# Patient Record
Sex: Male | Born: 1975 | Race: White | Hispanic: No | Marital: Single
Health system: Southern US, Community
[De-identification: ages and names within clinical notes are randomized; demographics above are authoritative.]

---

## 2011-09-27 ENCOUNTER — Other Ambulatory Visit: Payer: Self-pay | Admitting: Family Medicine

## 2011-09-27 DIAGNOSIS — R7401 Elevation of levels of liver transaminase levels: Secondary | ICD-10-CM

## 2011-09-28 ENCOUNTER — Ambulatory Visit
Admission: RE | Admit: 2011-09-28 | Discharge: 2011-09-28 | Disposition: A | Discharge status: Home / Self Care | Payer: BC Managed Care – PPO | Source: Ambulatory Visit | Attending: Family Medicine | Admitting: Family Medicine

## 2011-09-28 DIAGNOSIS — R7401 Elevation of levels of liver transaminase levels: Secondary | ICD-10-CM

## 2013-02-15 IMAGING — US US ABDOMEN COMPLETE
1 series · 14 of 25 positions shown · non-contrast
Comparison: None.

CLINICAL DATA: Abnormal liver enzymes, elevated LFTs

ABDOMINAL ULTRASOUND COMPLETE

[Series 1: us abdomen complete · 0.33mm/px · 14 of 63 slices shown]
[im 1/63]
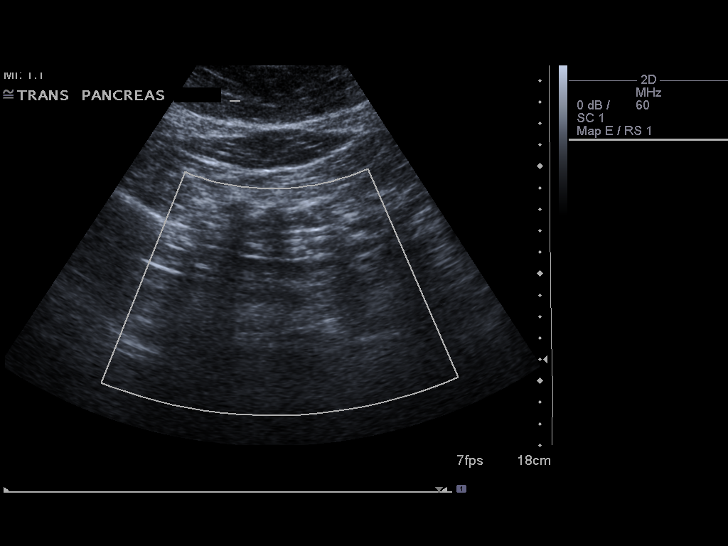
[im 6/63]
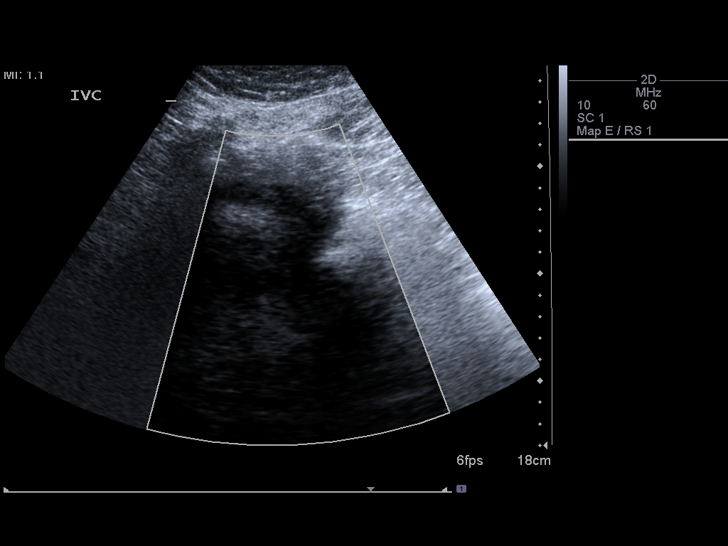
[im 11/63]
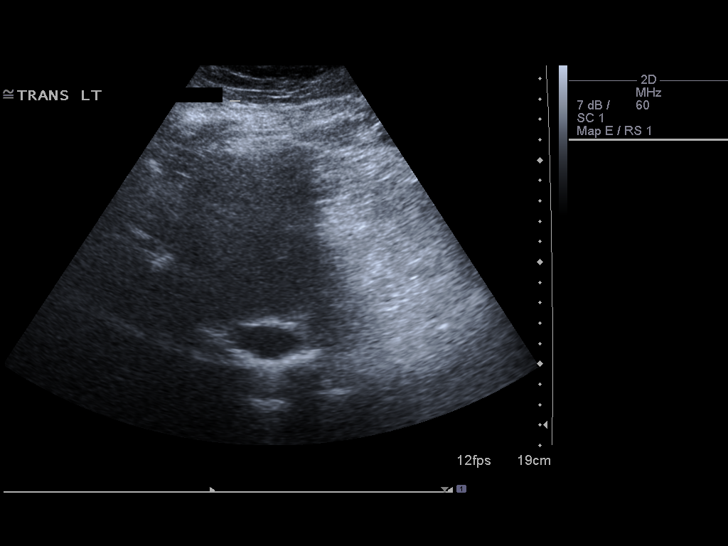
[im 16/63]
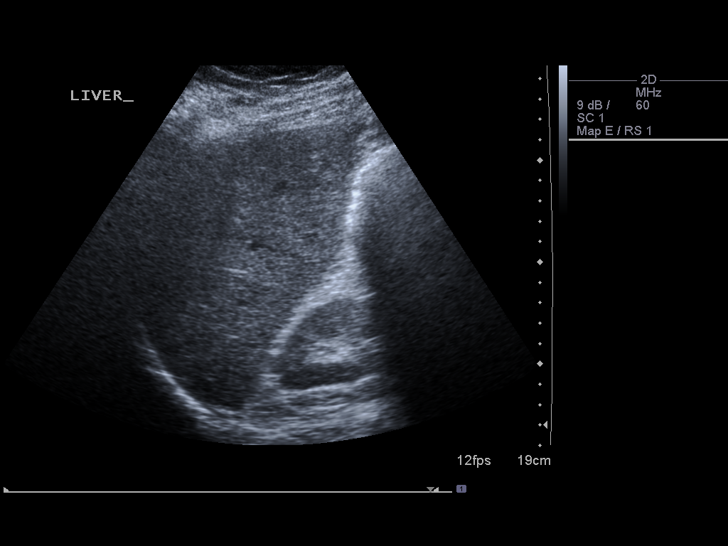
[im 21/63]
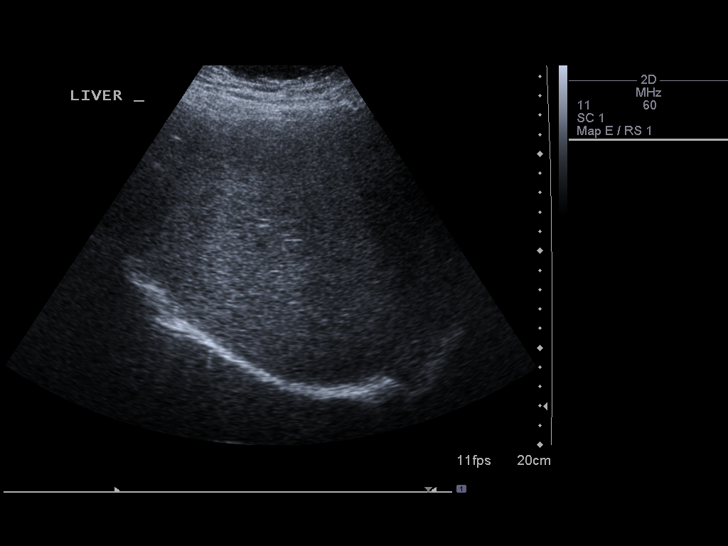
[im 24/63]
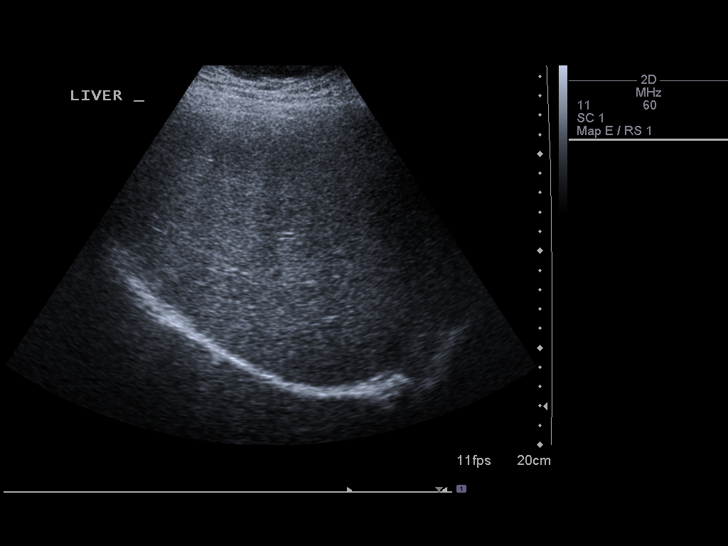
[im 29/63]
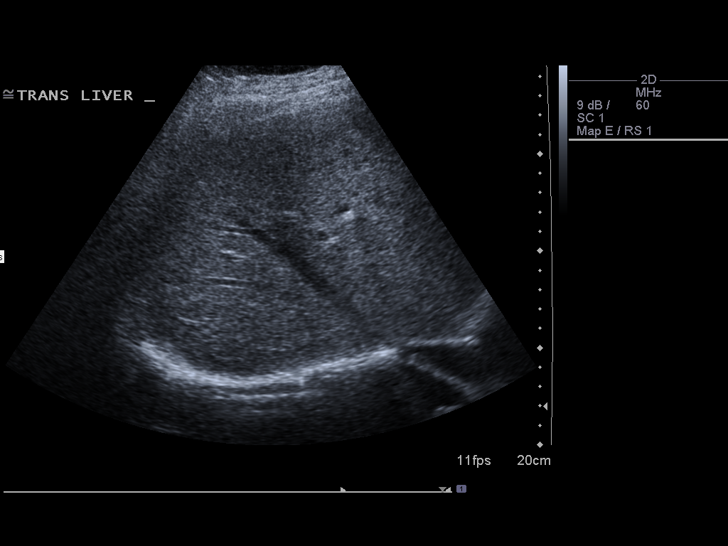
[im 34/63]
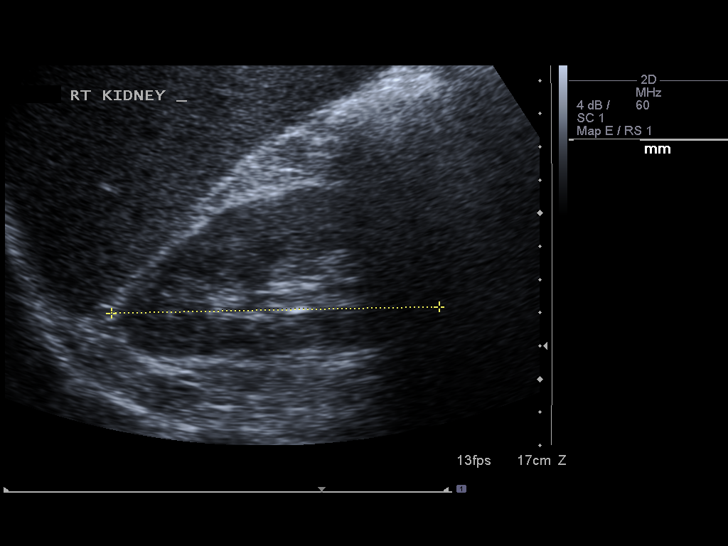
[im 39/63]
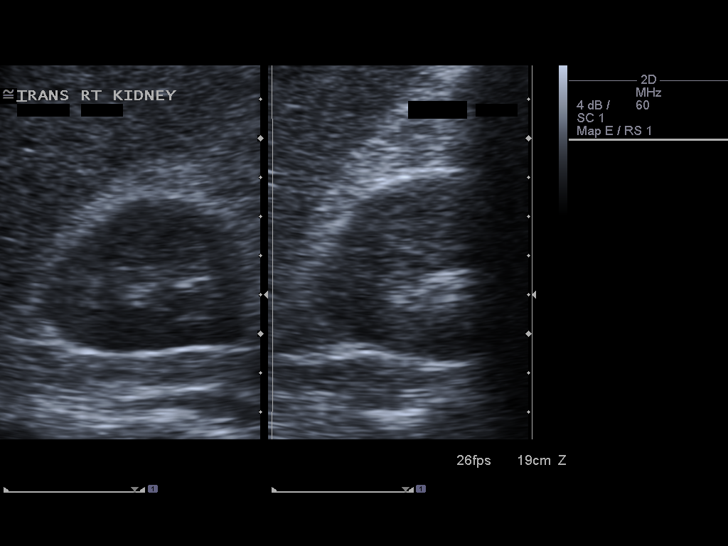
[im 42/63]
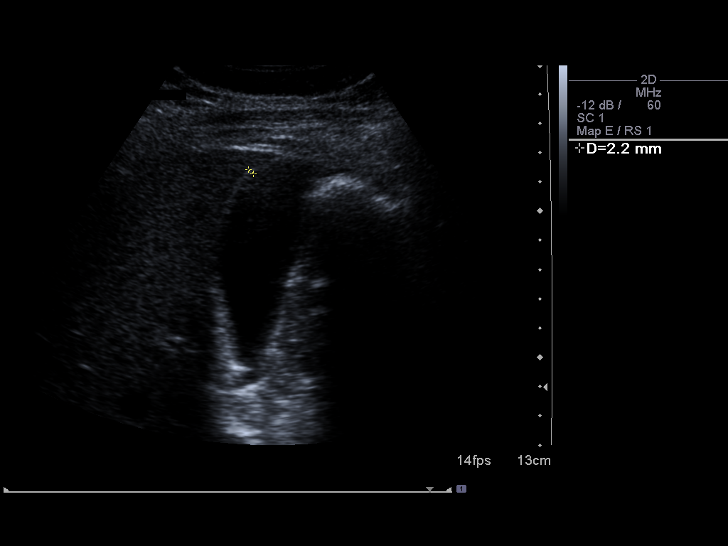
[im 47/63]
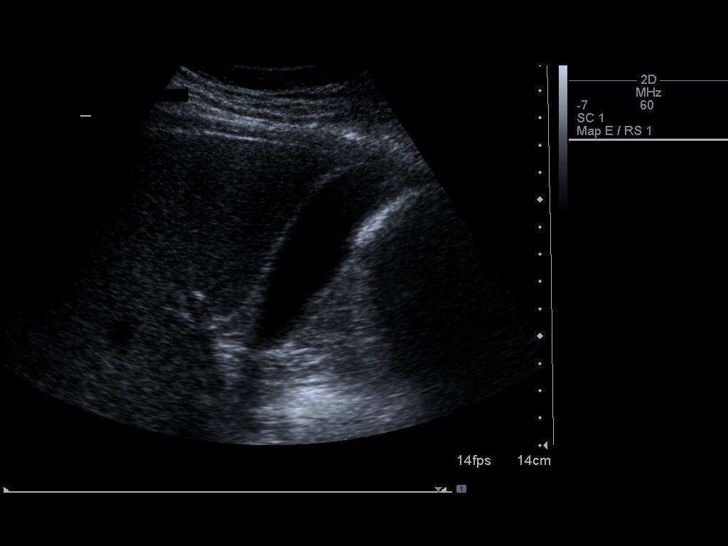
[im 52/63]
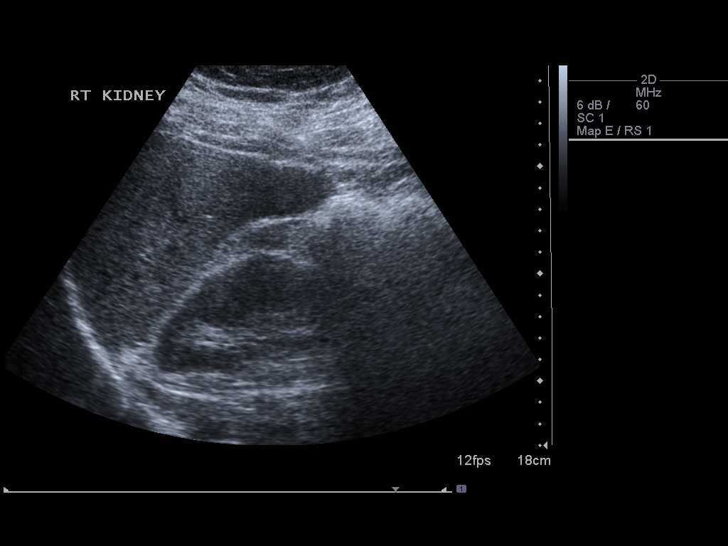
[im 57/63]
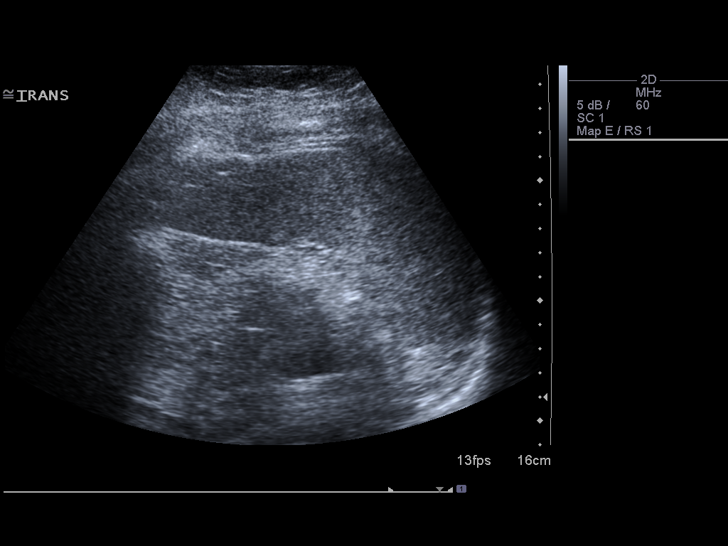
[im 63/63]
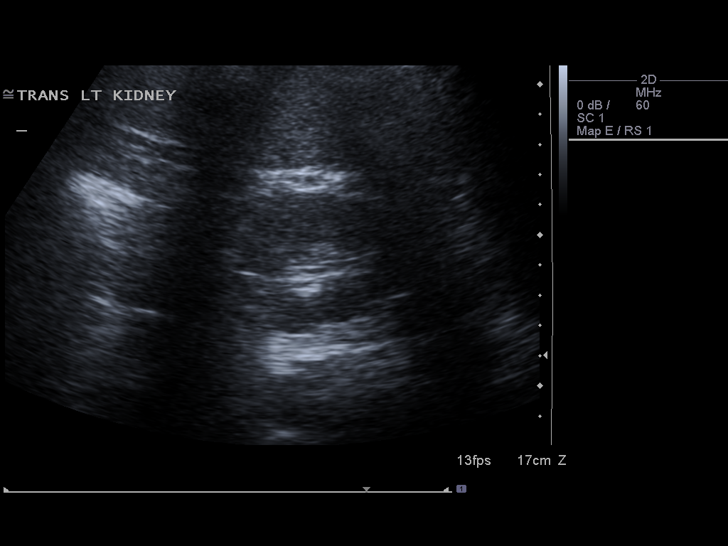

[14 of 25 positions shown; findings below may reference images not displayed]

FINDINGS: Gallbladder:  No gallstones, gallbladder wall thickening, or
pericholecystic fluid.

Common Bile Duct:  Within normal limits in caliber.

Liver: Mild heterogeneous increased echogenicity compatible with
hepatic steatosis or fatty infiltration.  No definite focal hepatic
abnormality or intrahepatic biliary dilatation.

IVC:  Not well visualized because of obscuring bowel gas.

Pancreas:  Not well visualized secondary to obscuring bowel gas.
No definite gross abnormality.

Spleen:  Within normal limits in size and echotexture.

Right kidney:  Normal in size and parenchymal echogenicity.  No
definite focal abnormality or hydronephrosis.  Limited
visualization of the lower pole because of obscuring bowel gas.

Left kidney:  Normal in size and parenchymal echogenicity.  No
evidence of mass or hydronephrosis.

Abdominal Aorta:  No aneurysm identified.
IMPRESSION: Limited exam as above.  No definite acute finding by ultrasound.

Probable mild hepatic steatosis

Negative for gallstones

## 2016-01-01 DIAGNOSIS — Z79899 Other long term (current) drug therapy: Secondary | ICD-10-CM | POA: Diagnosis not present

## 2016-01-01 DIAGNOSIS — Z13220 Encounter for screening for lipoid disorders: Secondary | ICD-10-CM | POA: Diagnosis not present

## 2016-05-26 DIAGNOSIS — Z23 Encounter for immunization: Secondary | ICD-10-CM | POA: Diagnosis not present

## 2016-06-22 DIAGNOSIS — Z23 Encounter for immunization: Secondary | ICD-10-CM | POA: Diagnosis not present

## 2016-06-22 DIAGNOSIS — S71119A Laceration without foreign body, unspecified thigh, initial encounter: Secondary | ICD-10-CM | POA: Diagnosis not present

## 2016-10-28 DIAGNOSIS — F338 Other recurrent depressive disorders: Secondary | ICD-10-CM | POA: Diagnosis not present

## 2016-10-28 DIAGNOSIS — Z79899 Other long term (current) drug therapy: Secondary | ICD-10-CM | POA: Diagnosis not present

## 2016-10-28 DIAGNOSIS — F900 Attention-deficit hyperactivity disorder, predominantly inattentive type: Secondary | ICD-10-CM | POA: Diagnosis not present

## 2017-05-05 DIAGNOSIS — Z23 Encounter for immunization: Secondary | ICD-10-CM | POA: Diagnosis not present

## 2017-05-05 DIAGNOSIS — F338 Other recurrent depressive disorders: Secondary | ICD-10-CM | POA: Diagnosis not present

## 2017-05-05 DIAGNOSIS — F900 Attention-deficit hyperactivity disorder, predominantly inattentive type: Secondary | ICD-10-CM | POA: Diagnosis not present

## 2017-05-09 DIAGNOSIS — Z8249 Family history of ischemic heart disease and other diseases of the circulatory system: Secondary | ICD-10-CM | POA: Diagnosis not present

## 2017-05-09 DIAGNOSIS — E668 Other obesity: Secondary | ICD-10-CM | POA: Diagnosis not present

## 2017-05-20 DIAGNOSIS — Z8249 Family history of ischemic heart disease and other diseases of the circulatory system: Secondary | ICD-10-CM | POA: Diagnosis not present

## 2017-11-03 DIAGNOSIS — F338 Other recurrent depressive disorders: Secondary | ICD-10-CM | POA: Diagnosis not present

## 2017-11-03 DIAGNOSIS — I517 Cardiomegaly: Secondary | ICD-10-CM | POA: Diagnosis not present

## 2017-11-03 DIAGNOSIS — F900 Attention-deficit hyperactivity disorder, predominantly inattentive type: Secondary | ICD-10-CM | POA: Diagnosis not present

## 2018-05-25 DIAGNOSIS — Z23 Encounter for immunization: Secondary | ICD-10-CM | POA: Diagnosis not present

## 2018-06-14 DIAGNOSIS — Z0000 Encounter for general adult medical examination without abnormal findings: Secondary | ICD-10-CM | POA: Diagnosis not present

## 2018-06-14 DIAGNOSIS — R8290 Unspecified abnormal findings in urine: Secondary | ICD-10-CM | POA: Diagnosis not present

## 2018-06-14 DIAGNOSIS — E785 Hyperlipidemia, unspecified: Secondary | ICD-10-CM | POA: Diagnosis not present

## 2018-06-14 DIAGNOSIS — Z79899 Other long term (current) drug therapy: Secondary | ICD-10-CM | POA: Diagnosis not present

## 2018-06-14 DIAGNOSIS — Z125 Encounter for screening for malignant neoplasm of prostate: Secondary | ICD-10-CM | POA: Diagnosis not present

## 2018-12-22 DIAGNOSIS — F900 Attention-deficit hyperactivity disorder, predominantly inattentive type: Secondary | ICD-10-CM | POA: Diagnosis not present

## 2018-12-22 DIAGNOSIS — I517 Cardiomegaly: Secondary | ICD-10-CM | POA: Diagnosis not present

## 2019-02-26 DIAGNOSIS — H04123 Dry eye syndrome of bilateral lacrimal glands: Secondary | ICD-10-CM | POA: Diagnosis not present

## 2019-04-13 DIAGNOSIS — Z23 Encounter for immunization: Secondary | ICD-10-CM | POA: Diagnosis not present

## 2019-07-18 DIAGNOSIS — Z125 Encounter for screening for malignant neoplasm of prostate: Secondary | ICD-10-CM | POA: Diagnosis not present

## 2019-07-18 DIAGNOSIS — Z13220 Encounter for screening for lipoid disorders: Secondary | ICD-10-CM | POA: Diagnosis not present

## 2019-07-18 DIAGNOSIS — Z79899 Other long term (current) drug therapy: Secondary | ICD-10-CM | POA: Diagnosis not present

## 2019-07-18 DIAGNOSIS — F900 Attention-deficit hyperactivity disorder, predominantly inattentive type: Secondary | ICD-10-CM | POA: Diagnosis not present

## 2019-07-18 DIAGNOSIS — Z0000 Encounter for general adult medical examination without abnormal findings: Secondary | ICD-10-CM | POA: Diagnosis not present

## 2019-07-19 ENCOUNTER — Other Ambulatory Visit (HOSPITAL_COMMUNITY): Payer: Self-pay | Admitting: Family Medicine

## 2019-07-19 DIAGNOSIS — I517 Cardiomegaly: Secondary | ICD-10-CM

## 2019-07-30 ENCOUNTER — Other Ambulatory Visit: Payer: Self-pay

## 2019-07-30 ENCOUNTER — Ambulatory Visit (HOSPITAL_COMMUNITY): Payer: BLUE CROSS/BLUE SHIELD | Attending: Cardiology

## 2019-07-30 DIAGNOSIS — I517 Cardiomegaly: Secondary | ICD-10-CM | POA: Diagnosis not present

## 2019-10-04 ENCOUNTER — Ambulatory Visit: Payer: Self-pay | Attending: Internal Medicine

## 2019-10-04 DIAGNOSIS — Z23 Encounter for immunization: Secondary | ICD-10-CM | POA: Insufficient documentation

## 2019-10-04 NOTE — Progress Notes (Signed)
   Covid-19 Vaccination Clinic  Name:  [name]    MRN: [id number] DOB: [birth year]  10/04/2019  Mr. Renz was observed post Covid-19 immunization for 15 minutes without incident. He was provided with Vaccine Information Sheet and instruction to access the V-Safe system.   Mr. Bartnick was instructed to call 911 with any severe reactions post vaccine: Marland Kitchen Difficulty breathing  . Swelling of face and throat  . A fast heartbeat  . A bad rash all over body  . Dizziness and weakness   Immunizations Administered    Name Date Dose VIS Date Route   Pfizer COVID-19 Vaccine 10/04/2019 12:22 PM 0.3 mL 07/13/2019 Intramuscular   Manufacturer: [employer], [employer]   Lot: [id number]   NDC: [address]-4

## 2019-10-30 ENCOUNTER — Ambulatory Visit: Payer: Self-pay | Attending: Internal Medicine

## 2019-10-30 DIAGNOSIS — Z23 Encounter for immunization: Secondary | ICD-10-CM

## 2019-10-30 NOTE — Progress Notes (Signed)
   Covid-19 Vaccination Clinic  Name:  Larry Hanson    MRN: [id number] DOB: Mar 13, 1976  10/30/2019  Larry Hanson was observed post Covid-19 immunization for 15 minutes without incident. He was provided with Vaccine Information Sheet and instruction to access the V-Safe system.   Larry Hanson was instructed to call 911 with any severe reactions post vaccine: Marland Kitchen Difficulty breathing  . Swelling of face and throat  . A fast heartbeat  . A bad rash all over body  . Dizziness and weakness   Immunizations Administered    Name Date Dose VIS Date Route   Pfizer COVID-19 Vaccine 10/30/2019  3:36 PM 0.3 mL 07/13/2019 Intramuscular   Manufacturer: [employer], [employer]   Lot: [id number]   NDC: [address]-3

## 2020-01-17 DIAGNOSIS — I517 Cardiomegaly: Secondary | ICD-10-CM | POA: Diagnosis not present

## 2020-01-17 DIAGNOSIS — F900 Attention-deficit hyperactivity disorder, predominantly inattentive type: Secondary | ICD-10-CM | POA: Diagnosis not present

## 2020-01-17 DIAGNOSIS — Z79899 Other long term (current) drug therapy: Secondary | ICD-10-CM | POA: Diagnosis not present

## 2020-01-17 DIAGNOSIS — E7800 Pure hypercholesterolemia, unspecified: Secondary | ICD-10-CM | POA: Diagnosis not present

## 2020-03-03 DIAGNOSIS — H04123 Dry eye syndrome of bilateral lacrimal glands: Secondary | ICD-10-CM | POA: Diagnosis not present

## 2020-08-07 DIAGNOSIS — E7800 Pure hypercholesterolemia, unspecified: Secondary | ICD-10-CM | POA: Diagnosis not present

## 2020-08-07 DIAGNOSIS — Z0000 Encounter for general adult medical examination without abnormal findings: Secondary | ICD-10-CM | POA: Diagnosis not present

## 2020-08-07 DIAGNOSIS — Z23 Encounter for immunization: Secondary | ICD-10-CM | POA: Diagnosis not present

## 2020-08-07 DIAGNOSIS — F900 Attention-deficit hyperactivity disorder, predominantly inattentive type: Secondary | ICD-10-CM | POA: Diagnosis not present

## 2020-10-14 DIAGNOSIS — W278XXA Contact with other nonpowered hand tool, initial encounter: Secondary | ICD-10-CM | POA: Diagnosis not present

## 2020-10-14 DIAGNOSIS — S61211A Laceration without foreign body of left index finger without damage to nail, initial encounter: Secondary | ICD-10-CM | POA: Diagnosis not present

## 2020-10-16 DIAGNOSIS — S61211D Laceration without foreign body of left index finger without damage to nail, subsequent encounter: Secondary | ICD-10-CM | POA: Diagnosis not present

## 2021-01-20 DIAGNOSIS — F3341 Major depressive disorder, recurrent, in partial remission: Secondary | ICD-10-CM | POA: Diagnosis not present

## 2021-01-20 DIAGNOSIS — I10 Essential (primary) hypertension: Secondary | ICD-10-CM | POA: Diagnosis not present

## 2021-01-20 DIAGNOSIS — F900 Attention-deficit hyperactivity disorder, predominantly inattentive type: Secondary | ICD-10-CM | POA: Diagnosis not present

## 2021-03-06 DIAGNOSIS — H04123 Dry eye syndrome of bilateral lacrimal glands: Secondary | ICD-10-CM | POA: Diagnosis not present

## 2021-08-18 DIAGNOSIS — Z0000 Encounter for general adult medical examination without abnormal findings: Secondary | ICD-10-CM | POA: Diagnosis not present

## 2021-08-18 DIAGNOSIS — E7800 Pure hypercholesterolemia, unspecified: Secondary | ICD-10-CM | POA: Diagnosis not present

## 2021-08-18 DIAGNOSIS — I10 Essential (primary) hypertension: Secondary | ICD-10-CM | POA: Diagnosis not present

## 2021-08-18 DIAGNOSIS — F3341 Major depressive disorder, recurrent, in partial remission: Secondary | ICD-10-CM | POA: Diagnosis not present

## 2021-08-18 DIAGNOSIS — F900 Attention-deficit hyperactivity disorder, predominantly inattentive type: Secondary | ICD-10-CM | POA: Diagnosis not present

## 2022-02-18 DIAGNOSIS — F900 Attention-deficit hyperactivity disorder, predominantly inattentive type: Secondary | ICD-10-CM | POA: Diagnosis not present

## 2022-02-18 DIAGNOSIS — K760 Fatty (change of) liver, not elsewhere classified: Secondary | ICD-10-CM | POA: Diagnosis not present

## 2022-02-18 DIAGNOSIS — I10 Essential (primary) hypertension: Secondary | ICD-10-CM | POA: Diagnosis not present

## 2022-02-18 DIAGNOSIS — F3341 Major depressive disorder, recurrent, in partial remission: Secondary | ICD-10-CM | POA: Diagnosis not present

## 2022-03-09 DIAGNOSIS — H2513 Age-related nuclear cataract, bilateral: Secondary | ICD-10-CM | POA: Diagnosis not present

## 2022-03-11 DIAGNOSIS — Z1211 Encounter for screening for malignant neoplasm of colon: Secondary | ICD-10-CM | POA: Diagnosis not present

## 2022-03-11 DIAGNOSIS — D122 Benign neoplasm of ascending colon: Secondary | ICD-10-CM | POA: Diagnosis not present

## 2022-03-11 DIAGNOSIS — D120 Benign neoplasm of cecum: Secondary | ICD-10-CM | POA: Diagnosis not present

## 2022-08-24 DIAGNOSIS — Z0000 Encounter for general adult medical examination without abnormal findings: Secondary | ICD-10-CM | POA: Diagnosis not present

## 2022-08-24 DIAGNOSIS — K760 Fatty (change of) liver, not elsewhere classified: Secondary | ICD-10-CM | POA: Diagnosis not present

## 2022-08-24 DIAGNOSIS — F900 Attention-deficit hyperactivity disorder, predominantly inattentive type: Secondary | ICD-10-CM | POA: Diagnosis not present

## 2022-08-24 DIAGNOSIS — E7800 Pure hypercholesterolemia, unspecified: Secondary | ICD-10-CM | POA: Diagnosis not present

## 2022-08-24 DIAGNOSIS — F3341 Major depressive disorder, recurrent, in partial remission: Secondary | ICD-10-CM | POA: Diagnosis not present

## 2022-08-24 DIAGNOSIS — I10 Essential (primary) hypertension: Secondary | ICD-10-CM | POA: Diagnosis not present

## 2022-09-22 DIAGNOSIS — I10 Essential (primary) hypertension: Secondary | ICD-10-CM | POA: Diagnosis not present

## 2023-02-22 DIAGNOSIS — F3341 Major depressive disorder, recurrent, in partial remission: Secondary | ICD-10-CM | POA: Diagnosis not present

## 2023-02-22 DIAGNOSIS — F900 Attention-deficit hyperactivity disorder, predominantly inattentive type: Secondary | ICD-10-CM | POA: Diagnosis not present

## 2023-02-22 DIAGNOSIS — I10 Essential (primary) hypertension: Secondary | ICD-10-CM | POA: Diagnosis not present

## 2023-03-21 DIAGNOSIS — H2513 Age-related nuclear cataract, bilateral: Secondary | ICD-10-CM | POA: Diagnosis not present

## 2023-05-20 DIAGNOSIS — Z23 Encounter for immunization: Secondary | ICD-10-CM | POA: Diagnosis not present

## 2023-08-30 DIAGNOSIS — E7800 Pure hypercholesterolemia, unspecified: Secondary | ICD-10-CM | POA: Diagnosis not present

## 2023-08-30 DIAGNOSIS — K760 Fatty (change of) liver, not elsewhere classified: Secondary | ICD-10-CM | POA: Diagnosis not present

## 2023-08-30 DIAGNOSIS — I10 Essential (primary) hypertension: Secondary | ICD-10-CM | POA: Diagnosis not present

## 2023-08-30 DIAGNOSIS — F900 Attention-deficit hyperactivity disorder, predominantly inattentive type: Secondary | ICD-10-CM | POA: Diagnosis not present

## 2023-08-30 DIAGNOSIS — Z0000 Encounter for general adult medical examination without abnormal findings: Secondary | ICD-10-CM | POA: Diagnosis not present

## 2024-03-16 DIAGNOSIS — E669 Obesity, unspecified: Secondary | ICD-10-CM | POA: Diagnosis not present

## 2024-03-16 DIAGNOSIS — F900 Attention-deficit hyperactivity disorder, predominantly inattentive type: Secondary | ICD-10-CM | POA: Diagnosis not present

## 2024-03-16 DIAGNOSIS — I10 Essential (primary) hypertension: Secondary | ICD-10-CM | POA: Diagnosis not present

## 2024-03-16 DIAGNOSIS — F3341 Major depressive disorder, recurrent, in partial remission: Secondary | ICD-10-CM | POA: Diagnosis not present

## 2024-03-16 DIAGNOSIS — E7800 Pure hypercholesterolemia, unspecified: Secondary | ICD-10-CM | POA: Diagnosis not present

## 2024-03-19 DIAGNOSIS — H43393 Other vitreous opacities, bilateral: Secondary | ICD-10-CM | POA: Diagnosis not present
